# Patient Record
Sex: Female | Born: 1980 | Race: White | Hispanic: No | Marital: Married | Smoking: Never smoker
Health system: Southern US, Community
[De-identification: ages and names within clinical notes are randomized; demographics above are authoritative.]

## PROBLEM LIST (undated history)

## (undated) DIAGNOSIS — Z87898 Personal history of other specified conditions: Secondary | ICD-10-CM

## (undated) DIAGNOSIS — Z8742 Personal history of other diseases of the female genital tract: Secondary | ICD-10-CM

## (undated) DIAGNOSIS — Z9889 Other specified postprocedural states: Secondary | ICD-10-CM

## (undated) HISTORY — DX: Personal history of other specified conditions: Z87.898

## (undated) HISTORY — DX: Other specified postprocedural states: Z98.890

## (undated) HISTORY — DX: Personal history of other diseases of the female genital tract: Z87.42

---

## 2004-12-11 DIAGNOSIS — Z8742 Personal history of other diseases of the female genital tract: Secondary | ICD-10-CM

## 2004-12-11 HISTORY — PX: OVARIAN CYST REMOVAL: SHX89

## 2004-12-11 HISTORY — DX: Personal history of other diseases of the female genital tract: Z87.42

## 2010-04-21 LAB — US OB COMP + 14 WK

## 2010-07-28 LAB — US OB COMP + 14 WK

## 2010-08-13 DIAGNOSIS — Z87898 Personal history of other specified conditions: Secondary | ICD-10-CM

## 2010-08-13 HISTORY — DX: Personal history of other specified conditions: Z87.898

## 2010-09-07 ENCOUNTER — Inpatient Hospital Stay (HOSPITAL_COMMUNITY)
Admission: AD | Admit: 2010-09-07 | Discharge: 2010-09-09 | Payer: Self-pay | Source: Home / Self Care | Attending: Obstetrics and Gynecology | Admitting: Obstetrics and Gynecology

## 2010-09-08 LAB — CBC
HCT: 33.7 % — ABNORMAL LOW (ref 36.0–46.0)
Hemoglobin: 11.4 g/dL — ABNORMAL LOW (ref 12.0–15.0)
MCH: 28.1 pg (ref 26.0–34.0)
MCHC: 33.8 g/dL (ref 30.0–36.0)

## 2010-09-09 LAB — CBC
HCT: 31.9 % — ABNORMAL LOW (ref 36.0–46.0)
MCV: 85.5 fL (ref 78.0–100.0)
RDW: 12.8 % (ref 11.5–15.5)
WBC: 9.7 10*3/uL (ref 4.0–10.5)

## 2011-10-02 ENCOUNTER — Encounter: Payer: Self-pay | Admitting: Family Medicine

## 2011-10-02 ENCOUNTER — Ambulatory Visit (INDEPENDENT_AMBULATORY_CARE_PROVIDER_SITE_OTHER): Payer: Self-pay | Admitting: Family Medicine

## 2011-10-02 ENCOUNTER — Telehealth: Payer: Self-pay | Admitting: Family Medicine

## 2011-10-02 VITALS — BP 120/76 | HR 88 | Temp 99.2°F | Wt 123.0 lb

## 2011-10-02 DIAGNOSIS — J019 Acute sinusitis, unspecified: Secondary | ICD-10-CM | POA: Insufficient documentation

## 2011-10-02 MED ORDER — AMOXICILLIN 875 MG PO TABS: 875.0000 mg | ORAL_TABLET | Freq: Two times a day (BID) | ORAL | Status: AC

## 2011-10-02 NOTE — Progress Notes (Signed)
Office Note 10/02/2011  CC:  Chief Complaint  Patient presents with  . Establish Care    cold, temp x 1 week    HPI:  Stephanie Mclaughlin is a 31 y.o. White female who is here to establish care and discuss recent URI/fevers. Patient's most recent primary MD: none [facility]). Old records were not reviewed prior to or during today's visit.  Pt presents complaining of respiratory symptoms for 7  days.  Mostly nasal congestion/runny nose, sneezing, and PND without any cough.  She's been monitoring temp in ear as a matter of routine (no subjective f/c) and has noted T 99-100 daily during this illness.  Lately the symptoms seem to be staying the same. No wheezing and no SOB.  No pain in face or teeth.  +HA on/off, responded to tylenol.  Throat irritated/scratchy but not sore.  Symptoms made worse by night time, cold air.  Symptoms improved by nothing. Smoker? no Recent sick contact? Yes, both of her young childen have had recent URIs with cough (of note, they are not vaccinated). Muscle or joint aches? Mild shoulder achiness with this.   Flu shot this season at least 2 wks ago? no  ROS: no n/v/d or abdominal pain.  No rash.  No neck stiffness.   +Mild fatigue.  +Mild appetite loss.  Past Medical History  Diagnosis Date  . History of ovarian cystectomy 12/2004    benign (left)    Past Surgical History  Procedure Date  . Ovarian cyst removal 12/2004    History reviewed. No pertinent family history.  History   Social History  . Marital Status: Married    Spouse Name: N/A    Number of Children: N/A  . Years of Education: N/A   Occupational History  . Not on file.   Social History Main Topics  . Smoking status: Never Smoker   . Smokeless tobacco: Never Used  . Alcohol Use: No  . Drug Use: No  . Sexually Active: Not on file   Other Topics Concern  . 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    MEDS: Tylenol prn  No Known Allergies  ROS Review of Systems  Constitutional: Negative for fever, chills, appetite change and fatigue.  HENT:       See HPI   Eyes: Negative for discharge, redness and visual disturbance.  Respiratory: Negative for cough, chest tightness, shortness of breath and wheezing.   Cardiovascular: Negative for chest pain, palpitations and leg swelling.  Gastrointestinal: Negative for nausea, vomiting, abdominal pain, diarrhea and blood in stool.  Genitourinary: Negative for dysuria, urgency, frequency, hematuria, flank pain and difficulty urinating.  Musculoskeletal: Negative for back pain and joint swelling.  Skin: Negative for pallor and rash.  Neurological: Negative for dizziness, speech difficulty, weakness and headaches.  Hematological: Negative for adenopathy. Does not bruise/bleed easily.  Psychiatric/Behavioral: Negative for confusion and sleep disturbance. The patient is not nervous/anxious.     PE; Blood pressure 120/76, pulse 88, temperature 99.2 F (37.3 C), temperature source Temporal, weight 123 lb (55.792 kg), last menstrual period 08/31/2011, SpO2 99.00%. VS: noted--normal. Gen: alert, NAD, well appearing.  Pleasant affect. HEENT: eyes without injection, drainage, or swelling.  Ears: EACs clear, TMs with normal light reflex and landmarks.  Nose: Clear rhinorrhea, with  some dried, crusty exudate adherent to mildly injected and edematous mucosa.  No purulent d/c.  No paranasal sinus TTP.  No facial swelling.  Throat and mouth without focal lesion.  No pharyngial swelling, erythema, or exudate.   Neck: supple, no LAD.   LUNGS: CTA bilat, nonlabored resps.   CV: RRR, no m/r/g. EXT: no c/c/e SKIN: no rash   Pertinent labs:  none  ASSESSMENT AND PLAN:   New pt:  Sinusitis  acute Amoxil 875mg  bid x 10d. Discussed use of nonsedating OTC antihistamine and saline nasal spray prn. Rest, fluids.     Return if symptoms worsen or fail to improve.

## 2011-10-02 NOTE — Assessment & Plan Note (Signed)
Amoxil 875mg  bid x 10d. Discussed use of nonsedating OTC antihistamine and saline nasal spray prn. Rest, fluids.

## 2011-10-02 NOTE — Telephone Encounter (Signed)
Faxed request

## 2011-10-02 NOTE — Telephone Encounter (Signed)
Pls request records from [facility].  thx.

## 2011-10-02 NOTE — Patient Instructions (Signed)
Try OTC allegra 180mg  OR zyrtec 10mg  OR claritin 10mg  as directed for your nasal mucous/post-nasal drip. Also, try OTC nasal saline spray 2-3 times per day.

## 2011-10-17 ENCOUNTER — Encounter: Payer: Self-pay | Admitting: Family Medicine

## 2013-10-31 ENCOUNTER — Emergency Department: Payer: Self-pay | Admitting: Internal Medicine

## 2013-10-31 LAB — COMPREHENSIVE METABOLIC PANEL
ALT: 11 U/L — AB (ref 12–78)
Albumin: 3.7 g/dL (ref 3.4–5.0)
Alkaline Phosphatase: 38 U/L — ABNORMAL LOW
Anion Gap: 7 (ref 7–16)
BILIRUBIN TOTAL: 0.4 mg/dL (ref 0.2–1.0)
BUN: 12 mg/dL (ref 7–18)
CALCIUM: 8.5 mg/dL (ref 8.5–10.1)
CHLORIDE: 104 mmol/L (ref 98–107)
CO2: 26 mmol/L (ref 21–32)
CREATININE: 0.82 mg/dL (ref 0.60–1.30)
EGFR (African American): >60
Glucose: 92 mg/dL (ref 65–99)
Osmolality: 273 (ref 275–301)
POTASSIUM: 3.6 mmol/L (ref 3.5–5.1)
SGOT(AST): 12 U/L — ABNORMAL LOW (ref 15–37)
Sodium: 137 mmol/L (ref 136–145)
TOTAL PROTEIN: 7.8 g/dL (ref 6.4–8.2)

## 2013-10-31 LAB — CBC
HCT: 39.3 % (ref 35.0–47.0)
HGB: 12.9 g/dL (ref 12.0–16.0)
MCH: 27.6 pg (ref 26.0–34.0)
MCHC: 32.8 g/dL (ref 32.0–36.0)
MCV: 84 fL (ref 80–100)
PLATELETS: 276 10*3/uL (ref 150–440)
RBC: 4.68 10*6/uL (ref 3.80–5.20)
RDW: 14.6 % — AB (ref 11.5–14.5)
WBC: 12.7 10*3/uL — ABNORMAL HIGH (ref 3.6–11.0)

## 2013-10-31 LAB — URINALYSIS, COMPLETE
BACTERIA: NONE SEEN
GLUCOSE, UR: NEGATIVE mg/dL (ref 0–75)
LEUKOCYTE ESTERASE: NEGATIVE
Nitrite: NEGATIVE
Ph: 6 (ref 4.5–8.0)
Protein: NEGATIVE
RBC, UR: NONE SEEN /HPF (ref 0–5)
Specific Gravity: 1.02 (ref 1.003–1.030)
Squamous Epithelial: <1

## 2013-10-31 LAB — LIPASE, BLOOD: Lipase: 114 U/L (ref 73–393)

## 2013-12-22 ENCOUNTER — Other Ambulatory Visit: Payer: Self-pay | Admitting: Nurse Practitioner

## 2013-12-22 DIAGNOSIS — N6310 Unspecified lump in the right breast, unspecified quadrant: Secondary | ICD-10-CM

## 2013-12-24 ENCOUNTER — Other Ambulatory Visit: Payer: Self-pay | Admitting: Nurse Practitioner

## 2013-12-24 ENCOUNTER — Other Ambulatory Visit: Payer: Self-pay | Admitting: Family Medicine

## 2013-12-24 DIAGNOSIS — N6310 Unspecified lump in the right breast, unspecified quadrant: Secondary | ICD-10-CM

## 2013-12-25 ENCOUNTER — Other Ambulatory Visit: Payer: Self-pay | Admitting: Family Medicine

## 2013-12-25 DIAGNOSIS — N6310 Unspecified lump in the right breast, unspecified quadrant: Secondary | ICD-10-CM

## 2014-01-05 ENCOUNTER — Ambulatory Visit
Admission: RE | Admit: 2014-01-05 | Discharge: 2014-01-05 | Disposition: A | Discharge status: Home / Self Care | Payer: PRIVATE HEALTH INSURANCE | Source: Ambulatory Visit | Attending: Nurse Practitioner | Admitting: Nurse Practitioner

## 2014-01-05 DIAGNOSIS — N6310 Unspecified lump in the right breast, unspecified quadrant: Secondary | ICD-10-CM

## 2014-11-07 ENCOUNTER — Emergency Department: Payer: Self-pay | Admitting: Emergency Medicine

## 2014-11-07 LAB — URINALYSIS, COMPLETE
Bacteria: NONE SEEN
Bilirubin,UR: NEGATIVE
Glucose,UR: NEGATIVE mg/dL (ref 0–75)
LEUKOCYTE ESTERASE: NEGATIVE
Nitrite: NEGATIVE
PH: 5 (ref 4.5–8.0)
Specific Gravity: 1.026 (ref 1.003–1.030)
Squamous Epithelial: NONE SEEN
WBC UR: 4 /HPF (ref 0–5)

## 2014-11-07 LAB — CBC
HCT: 38.9 % (ref 35.0–47.0)
HGB: 13.2 g/dL (ref 12.0–16.0)
MCH: 30.4 pg (ref 26.0–34.0)
MCHC: 34 g/dL (ref 32.0–36.0)
MCV: 90 fL (ref 80–100)
PLATELETS: 221 10*3/uL (ref 150–440)
RBC: 4.35 10*6/uL (ref 3.80–5.20)
RDW: 12.3 % (ref 11.5–14.5)
WBC: 7.1 10*3/uL (ref 3.6–11.0)

## 2014-11-07 LAB — HCG, QUANTITATIVE, PREGNANCY: BETA HCG, QUANT.: 270617 m[IU]/mL — AB

## 2016-09-01 ENCOUNTER — Emergency Department
Admission: EM | Admit: 2016-09-01 | Discharge: 2016-09-01 | Disposition: A | Discharge status: Home / Self Care | Payer: PRIVATE HEALTH INSURANCE | Attending: Emergency Medicine | Admitting: Emergency Medicine

## 2016-09-01 ENCOUNTER — Encounter: Payer: Self-pay | Admitting: Emergency Medicine

## 2016-09-01 ENCOUNTER — Emergency Department: Payer: PRIVATE HEALTH INSURANCE

## 2016-09-01 DIAGNOSIS — J181 Lobar pneumonia, unspecified organism: Secondary | ICD-10-CM | POA: Insufficient documentation

## 2016-09-01 DIAGNOSIS — R110 Nausea: Secondary | ICD-10-CM | POA: Insufficient documentation

## 2016-09-01 DIAGNOSIS — J189 Pneumonia, unspecified organism: Secondary | ICD-10-CM

## 2016-09-01 MED ORDER — AZITHROMYCIN 250 MG PO TABS: ORAL_TABLET | ORAL | 0 refills | Status: DC

## 2016-09-01 MED ORDER — ALBUTEROL SULFATE HFA 108 (90 BASE) MCG/ACT IN AERS: 2.0000 | INHALATION_SPRAY | Freq: Four times a day (QID) | RESPIRATORY_TRACT | 2 refills | Status: DC | PRN

## 2016-09-01 MED ORDER — IPRATROPIUM-ALBUTEROL 0.5-2.5 (3) MG/3ML IN SOLN
3.0000 mL | Freq: Once | RESPIRATORY_TRACT | Status: AC
  Administered 2016-09-01: 3 mL via RESPIRATORY_TRACT
  Filled 2016-09-01: qty 3

## 2016-09-01 MED ORDER — HYDROCODONE-ACETAMINOPHEN 5-325 MG PO TABS
1.0000 | ORAL_TABLET | Freq: Once | ORAL | Status: AC
  Administered 2016-09-01: 1 via ORAL

## 2016-09-01 MED ORDER — HYDROCOD POLST-CPM POLST ER 10-8 MG/5ML PO SUER
5.0000 mL | Freq: Two times a day (BID) | ORAL | 0 refills | Status: DC
Start: 2016-09-01 — End: 2018-12-03

## 2016-09-01 MED ORDER — ONDANSETRON 4 MG PO TBDP
4.0000 mg | ORAL_TABLET | Freq: Once | ORAL | Status: DC
  Filled 2016-09-01: qty 1

## 2016-09-01 MED ORDER — HYDROCODONE-ACETAMINOPHEN 5-325 MG PO TABS
2.0000 | ORAL_TABLET | Freq: Once | ORAL | Status: DC
  Filled 2016-09-01: qty 2

## 2016-09-01 NOTE — ED Notes (Signed)
Returned from XR 

## 2016-09-01 NOTE — ED Notes (Signed)
Dx with the flu 10 days ago, pain in rib cage when coughing bilaterally.  Pt states at rest her pain is 6/10 when coughing pain is 10/10.  Painful to take in a deep breath.

## 2016-09-01 NOTE — Discharge Instructions (Signed)
Take Zithromax for the next 5 days. This antibiotic was standing near system for 10 days. Tussionex 1 teaspoon every 12 hours as needed for cough. This medication has a narcotic in it and may cause drowsiness. Albuterol inhaler 2 puffs every 6 hours if needed for wheezing or shortness of breath. He may follow up with your doctor in [place] [facility]. If any worsening of your symptoms return to the emergency room.

## 2016-09-01 NOTE — ED Notes (Signed)
Pt refused zofran that was ordered, pt hadn't been wanting to eat so provider thought maybe pt would benefit from some anti-nausea medication, but pt refused. Explained to pt why this was ordered, however, pt still declined.

## 2016-09-01 NOTE — ED Notes (Signed)
Pt verbalized understanding of discharge instructions. NAD at this time. 

## 2016-09-01 NOTE — ED Triage Notes (Signed)
Cough, fever, pain in left ribs when coughing, sore throat.  No resp distress.  Mask applied.

## 2016-09-01 NOTE — ED Provider Notes (Signed)
[facility] Emergency Department Provider Note  ____________________________________________   First MD Initiated Contact with Patient 09/01/16 [phone]     (approximate)  I have reviewed the triage vital signs and the nursing notes.   HISTORY  Chief Complaint Cough   HPI Stephanie Mclaughlin is a 36 y.o. female is here with complaint of cough and fever. Husband states that patient and child at home was presumed to have the flu approximately 10 days ago. Child is already running around playing with a continued cough but is doing much better. Patient has continued to cough and last night began complaining of painful ribs with deep inspiration. Patient has subjective fever and occasional chills. There is been some minimal nausea and decreased appetite. Patient is been taking some over-the-counter medication with minimal relief. She denies any vomiting or diarrhea. She has never been a smoker. She denies any history of asthma, bronchitis or pneumonia. She rates her pain as an 8 out of 10.   Past Medical History:  Diagnosis Date  . History of mastitis 2012   Postpartum  . History of ovarian cystectomy 12/2004   benign (left)  . NSVD (normal spontaneous vaginal delivery)     X 2     Patient Active Problem List   Diagnosis Date Noted  . Sinusitis acute 10/02/2011    Past Surgical History:  Procedure Laterality Date  . OVARIAN CYST REMOVAL  12/2004    Prior to Admission medications   Medication Sig Start Date End Date Taking? Authorizing Provider  albuterol (PROVENTIL HFA;VENTOLIN HFA) 108 (90 Base) MCG/ACT inhaler Inhale 2 puffs into the lungs every 6 (six) hours as needed for wheezing or shortness of breath. 09/01/16   Tommi Rumps, PA-C  azithromycin (ZITHROMAX Z-PAK) 250 MG tablet Take 2 tablets (500 mg) on  Day 1,  followed by 1 tablet (250 mg) once daily on Days 2 through 5. 09/01/16   Tommi Rumps, PA-C  chlorpheniramine-HYDROcodone (TUSSIONEX  PENNKINETIC ER) 10-8 MG/5ML SUER Take 5 mLs by mouth 2 (two) times daily. 09/01/16   Tommi Rumps, PA-C    Allergies Patient has no known allergies.  No family history on file.  Social History Social History  Substance Use Topics  . Smoking status: Never Smoker  . Smokeless tobacco: Never Used  . Alcohol use No    Review of Systems Constitutional:Subjective fever/chills Eyes: No visual changes. ENT: No sore throat. Cardiovascular: Denies chest pain. Respiratory: Positive shortness of breath. Positive chest wall pain with cough. Gastrointestinal: No abdominal pain.  Positive nausea, no vomiting.  No diarrhea.   Musculoskeletal: Positive for rib pain. Skin: Negative for rash. Neurological: Negative for headaches, focal weakness or numbness.  10-point ROS otherwise negative.  ____________________________________________   PHYSICAL EXAM:  VITAL SIGNS: ED Triage Vitals [09/01/16 0825]  Enc Vitals Group     BP 100/62     Pulse Rate 95     Resp 18     Temp 98.2 F (36.8 C)     Temp src      SpO2 96 %     Weight 130 lb (59 kg)     Height 5\' 7"  (1.702 m)     Head Circumference      Peak Flow      Pain Score 8     Pain Loc      Pain Edu?      Excl. in GC?     Constitutional: Alert and oriented. Well appearing and in  no acute distress. Eyes: Conjunctivae are normal. PERRL. EOMI. Head: Atraumatic. Nose: Mild congestion/rhinnorhea. Mouth/Throat: Mucous membranes are moist.  Oropharynx non-erythematous. Neck: No stridor.   Hematological/Lymphatic/Immunilogical: No cervical lymphadenopathy. Cardiovascular: Normal rate, regular rhythm. Grossly normal heart sounds.  Good peripheral circulation. Respiratory: Normal respiratory effort.  No retractions. Lungs mild bilateral expiratory wheeze and crackles with coughing. Right is greater than the left side. Musculoskeletal: Moves upper and lower extremities without any difficulty. Gait is slow but steady. Neurologic:   Normal speech and language. No gross focal neurologic deficits are appreciated.  Skin:  Skin is warm, dry and intact. No rash noted. Psychiatric: Mood and affect are normal. Speech and behavior are normal.  ____________________________________________   LABS (all labs ordered are listed, but only abnormal results are displayed)  Labs Reviewed - No data to display  RADIOLOGY Chest x-ray per radiologist: IMPRESSION:  Mild airspace disease in the right middle lobe most concerning for  atelectasis versus pneumonia.     ____________________________________________   PROCEDURES  Procedure(s) performed: None  Procedures  Critical Care performed: No  ____________________________________________   INITIAL IMPRESSION / ASSESSMENT AND PLAN / ED COURSE  Pertinent labs & imaging results that were available during my care of the patient were reviewed by me and considered in my medical decision making (see chart for details).  Patient was given a DuoNeb treatment while in the emergency room and improved greatly. Patient was also given Norco to reduce cough and also help with her chest wall pain. Reevaluation after the DuoNeb treatment patient was exchanging air much better and was less coughing. Patient appeared to be resting comfortably. Patient was discharged with prescription for albuterol inhaler, Zithromax, and Tussionex 1 teaspoon every 12 hours as needed for cough. She is aware that this medication contains a narcotic and could cause drowsiness. Follow-up with her primary care doctor and [place] or [facility] if any continued problems. Husband is aware that should she become much worse she is to return to the emergency room.    ___________________________________________   FINAL CLINICAL IMPRESSION(S) / ED DIAGNOSES  Final diagnoses:  Community acquired pneumonia of right middle lobe of lung (HCC)      NEW MEDICATIONS STARTED DURING THIS VISIT:  Discharge  Medication List as of 09/01/2016 10:16 AM    START taking these medications   Details  albuterol (PROVENTIL HFA;VENTOLIN HFA) 108 (90 Base) MCG/ACT inhaler Inhale 2 puffs into the lungs every 6 (six) hours as needed for wheezing or shortness of breath., Starting Sat 09/01/2016, Print    azithromycin (ZITHROMAX Z-PAK) 250 MG tablet Take 2 tablets (500 mg) on  Day 1,  followed by 1 tablet (250 mg) once daily on Days 2 through 5., Print    chlorpheniramine-HYDROcodone (TUSSIONEX PENNKINETIC ER) 10-8 MG/5ML SUER Take 5 mLs by mouth 2 (two) times daily., Starting Sat 09/01/2016, Print         Note:  This document was prepared using Dragon voice recognition software and may include unintentional dictation errors.    Tommi RumpsRhonda L Halea Lieb, PA-C 09/01/16 1100    Minna AntisKevin Paduchowski, MD 09/01/16 1555

## 2018-12-03 ENCOUNTER — Emergency Department: Payer: PRIVATE HEALTH INSURANCE

## 2018-12-03 ENCOUNTER — Other Ambulatory Visit: Payer: Self-pay

## 2018-12-03 ENCOUNTER — Emergency Department
Admission: EM | Admit: 2018-12-03 | Discharge: 2018-12-03 | Disposition: A | Discharge status: Home / Self Care | Payer: PRIVATE HEALTH INSURANCE | Attending: Emergency Medicine | Admitting: Emergency Medicine

## 2018-12-03 DIAGNOSIS — R1030 Lower abdominal pain, unspecified: Secondary | ICD-10-CM | POA: Diagnosis present

## 2018-12-03 DIAGNOSIS — K3580 Unspecified acute appendicitis: Secondary | ICD-10-CM

## 2018-12-03 DIAGNOSIS — R102 Pelvic and perineal pain: Secondary | ICD-10-CM

## 2018-12-03 LAB — URINALYSIS, COMPLETE (UACMP) WITH MICROSCOPIC
Bacteria, UA: NONE SEEN
Bilirubin Urine: NEGATIVE
Glucose, UA: NEGATIVE mg/dL
Ketones, ur: 20 mg/dL — AB
Leukocytes,Ua: NEGATIVE
Nitrite: NEGATIVE
Protein, ur: NEGATIVE mg/dL
Specific Gravity, Urine: 1.025 (ref 1.005–1.030)
pH: 5 (ref 5.0–8.0)

## 2018-12-03 LAB — COMPREHENSIVE METABOLIC PANEL
ALT: 10 U/L (ref 0–44)
AST: 15 U/L (ref 15–41)
Albumin: 4.3 g/dL (ref 3.5–5.0)
Alkaline Phosphatase: 48 U/L (ref 38–126)
Anion gap: 12 (ref 5–15)
BUN: 12 mg/dL (ref 6–20)
CO2: 25 mmol/L (ref 22–32)
Calcium: 8.8 mg/dL — ABNORMAL LOW (ref 8.9–10.3)
Chloride: 102 mmol/L (ref 98–111)
Creatinine, Ser: 0.72 mg/dL (ref 0.44–1.00)
GFR calc Af Amer: >60 mL/min (ref >60)
GFR calc non Af Amer: >60 mL/min (ref >60)
Glucose, Bld: 94 mg/dL (ref 70–99)
Potassium: 3.6 mmol/L (ref 3.5–5.1)
Sodium: 139 mmol/L (ref 135–145)
Total Bilirubin: 0.8 mg/dL (ref 0.3–1.2)
Total Protein: 7.6 g/dL (ref 6.5–8.1)

## 2018-12-03 LAB — CBC
HCT: 43.4 % (ref 36.0–46.0)
Hemoglobin: 14.2 g/dL (ref 12.0–15.0)
MCH: 29.8 pg (ref 26.0–34.0)
MCHC: 32.7 g/dL (ref 30.0–36.0)
MCV: 91.2 fL (ref 80.0–100.0)
Platelets: 246 10*3/uL (ref 150–400)
RBC: 4.76 MIL/uL (ref 3.87–5.11)
RDW: 12.3 % (ref 11.5–15.5)
WBC: 7.9 10*3/uL (ref 4.0–10.5)
nRBC: 0 % (ref 0.0–0.2)

## 2018-12-03 LAB — LIPASE, BLOOD: Lipase: 27 U/L (ref 11–51)

## 2018-12-03 LAB — POCT PREGNANCY, URINE: Preg Test, Ur: NEGATIVE

## 2018-12-03 MED ORDER — SODIUM CHLORIDE 0.9 % IV SOLN: Freq: Once | INTRAVENOUS | Status: DC

## 2018-12-03 MED ORDER — AMOXICILLIN-POT CLAVULANATE ER 1000-62.5 MG PO TB12: 1.0000 | ORAL_TABLET | Freq: Two times a day (BID) | ORAL | 0 refills | Status: AC

## 2018-12-03 MED ORDER — IOHEXOL 300 MG/ML  SOLN
100.0000 mL | Freq: Once | INTRAMUSCULAR | Status: AC | PRN
  Administered 2018-12-03: 11:00:00 100 mL via INTRAVENOUS

## 2018-12-03 NOTE — ED Notes (Signed)
Pt still pumping breast milk- will start fluids after she is finished

## 2018-12-03 NOTE — ED Notes (Signed)
Patient transported to Ultrasound 

## 2018-12-03 NOTE — ED Notes (Signed)
From 9-5 yesterday abdominal pain all over- tenderness in the right lower abdomen since- pain comes and goes in waves

## 2018-12-03 NOTE — ED Triage Notes (Addendum)
Pt reporting constant abdominal pain that started last night, right lower that radiates across to left side. Pt reports pain stopped last night but now has begun again. Denies NVD.  Pt alert and oriented X4, active, cooperative, pt in NAD. RR even and unlabored, color WNL.    Pt states that she did not eat much this morning for breakfast due to anticipation for "svcan". Explained to patient that she is correct in being NPO from this point on until further order from MD to eat/drink. When rooming patient, pt asked to provide urine sample-pt states that she needs water to provide urine sample, explained importance of continuing NPO status until evaluated by doctor.

## 2018-12-03 NOTE — ED Provider Notes (Signed)
[facility] Emergency Department Provider Note       Time seen: ----------------------------------------- 10:01 AM on 12/03/2018 -----------------------------------------   I have reviewed the triage vital signs and the nursing notes.  HISTORY   Chief Complaint Abdominal Pain    HPI Stephanie Mclaughlin is a 38 y.o. female with a history of normal vaginal deliveries and ovarian cystectomy who presents to the ED for lower quadrant pain.  Patient describes a constant abdominal pain that feels like a type of gas pain that started last night.  She has right lower quadrant pain that radiates to her left side.  She denies fevers, chills, chest pain, shortness of breath, vomiting or diarrhea.  Past Medical History:  Diagnosis Date  . History of mastitis 2012   Postpartum  . History of ovarian cystectomy 12/2004   benign (left)  . NSVD (normal spontaneous vaginal delivery)     X 2     Patient Active Problem List   Diagnosis Date Noted  . Sinusitis acute 10/02/2011    Past Surgical History:  Procedure Laterality Date  . OVARIAN CYST REMOVAL  12/2004    Allergies Patient has no known allergies.  Social History Social History   Tobacco Use  . Smoking status: Never Smoker  . Smokeless tobacco: Never Used  Substance Use Topics  . Alcohol use: No  . Drug use: No   Review of Systems Constitutional: Negative for fever. Cardiovascular: Negative for chest pain. Respiratory: Negative for shortness of breath. Gastrointestinal: Positive for abdominal pain Musculoskeletal: Negative for back pain. Skin: Negative for rash. Neurological: Negative for headaches, focal weakness or numbness.  All systems negative/normal/unremarkable except as stated in the HPI  ____________________________________________   PHYSICAL EXAM:  VITAL SIGNS: ED Triage Vitals [12/03/18 0943]  Enc Vitals Group     BP 126/81     Pulse Rate 90     Resp 18     Temp 98.5 F (36.9  C)     Temp Source Oral     SpO2 100 %     Weight 130 lb (59 kg)     Height 5\' 7"  (1.702 m)     Head Circumference      Peak Flow      Pain Score 0     Pain Loc      Pain Edu?      Excl. in GC?    Constitutional: Alert and oriented.  Anxious, no distress Eyes: Conjunctivae are normal. Normal extraocular movements. ENT      Head: Normocephalic and atraumatic.      Nose: No congestion/rhinnorhea.      Mouth/Throat: Mucous membranes are moist.      Neck: No stridor. Cardiovascular: Normal rate, regular rhythm. No murmurs, rubs, or gallops. Respiratory: Normal respiratory effort without tachypnea nor retractions. Breath sounds are clear and equal bilaterally. No wheezes/rales/rhonchi. Gastrointestinal: Right lower quadrant tenderness, no rebound or guarding.  Normal bowel sounds. Musculoskeletal: Nontender with normal range of motion in extremities. No lower extremity tenderness nor edema. Neurologic:  Normal speech and language. No gross focal neurologic deficits are appreciated.  Skin:  Skin is warm, dry and intact. No rash noted. Psychiatric: Mood and affect are normal. Speech and behavior are normal.  ____________________________________________  ED COURSE:  As part of my medical decision making, I reviewed the following data within the electronic MEDICAL RECORD NUMBER History obtained from family if available, nursing notes, old chart and ekg, as well as notes from prior ED visits. Patient  presented for right lower quadrant pain, we will assess with labs and imaging as indicated at this time.   Procedures  Ophelia CharterHeather Vanhoose was evaluated in Emergency Department on 12/03/2018 for the symptoms described in the history of present illness. She was evaluated in the context of the global COVID-19 pandemic, which necessitated consideration that the patient might be at risk for infection with the SARS-CoV-2 virus that causes COVID-19. Institutional protocols and algorithms that pertain to the  evaluation of patients at risk for COVID-19 are in a state of rapid change based on information released by regulatory bodies including the CDC and federal and state organizations. These policies and algorithms were followed during the patient's care in the ED.  ____________________________________________   LABS (pertinent positives/negatives)  Labs Reviewed  COMPREHENSIVE METABOLIC PANEL - Abnormal; Notable for the following components:      Result Value   Calcium 8.8 (*)    All other components within normal limits  URINALYSIS, COMPLETE (UACMP) WITH MICROSCOPIC - Abnormal; Notable for the following components:   Color, Urine YELLOW (*)    APPearance HAZY (*)    Hgb urine dipstick SMALL (*)    Ketones, ur 20 (*)    All other components within normal limits  LIPASE, BLOOD  CBC  POC URINE PREG, ED  POCT PREGNANCY, URINE    RADIOLOGY Images were viewed by me  Pelvic ultrasound does not reveal any acute process CT the abdomen pelvis with contrast IMPRESSION: 1. Equivocal for early acute appendicitis: Suggestion of thickened and dilated appendix along the right pelvic side wall, but no convincing regional inflammation, and it is possible this might be artifact from a decompressed distal small bowel loop. Depending on symptoms, a repeat CT in 12-24 hours of the pelvis to include the lower abdomen and following oral contrast administration may best evaluate further. 2. Questionable bowel wall thickening in the descending colon felt to be artifact due to under distention. 3. Otherwise normal CT appearance of the abdomen and pelvis. ____________________________________________   DIFFERENTIAL DIAGNOSIS   Appendicitis, renal colic, UTI, ovarian cyst, torsion  FINAL ASSESSMENT AND PLAN  Acute appendicitis   Plan: The patient had presented for lower quadrant pain. Patient's labs are reassuring. Patient's imaging did reveal acute appendicitis.  Case was reviewed with surgery who  agreed.  I have discussed this at length with the patient who would prefer not to have surgery at this time but would rather try oral antibiotics which I think is reasonable.  Her pain is gone at this point.  She be placed on Augmentin and referred to general surgery for outpatient follow-up.   Ulice DashJohnathan E Williams, MD    Note: This note was generated in part or whole with voice recognition software. Voice recognition is usually quite accurate but there are transcription errors that can and very often do occur. I apologize for any typographical errors that were not detected and corrected.     Emily FilbertWilliams, Jonathan E, MD 12/03/18 1254

## 2018-12-04 ENCOUNTER — Telehealth: Payer: Self-pay | Admitting: General Surgery

## 2018-12-04 NOTE — Telephone Encounter (Signed)
Patient called to follow-up from ED visit yesterday.  She reports that she is feeling better today.  She reports that she has been compliant with antibiotic regimen that was prescribed at the ED.  She reports that today the issue has been a headache most likely from the stress of the situation but reports that the abdominal pain is minimal and intermittent.  Denies any nausea or vomiting denies fever or chills.  Patient was oriented about the signs that will make her go back to the ED or come to my clinic.  Oriented that if she developed fever nausea vomiting or worsening abdominal pain she will have to call the clinic or go to the ED for urgent evaluation.

## 2018-12-04 NOTE — Telephone Encounter (Signed)
Patient called for follow up of her visit yesterday at ED. Patient with suspected appendicitis. ED physician discussed the case with me which I recommended surgery but patient did not want to have surgery at this moment since pain has resolved. It was discussed with me to see if I can follow her after discharge with oral antibiotic therapy. I agreed to follow.   Patient called this morning but no answer. I left a voicemail to call me back and I will try to call her back later if she does call.

## 2020-09-14 IMAGING — US US PELVIS COMPLETE
1 series · 13 of 25 positions shown · non-contrast
Comparison: CT abdomen pelvis from same day.

CLINICAL DATA: Acute pelvic pain.

EXAM:
TRANSABDOMINAL AND TRANSVAGINAL ULTRASOUND OF PELVIS
DOPPLER ULTRASOUND OF OVARIES
TECHNIQUE: Both transabdominal and transvaginal ultrasound examinations of the
pelvis were performed. Transabdominal technique was performed for
global imaging of the pelvis including uterus, ovaries, adnexal
regions, and pelvic cul-de-sac.
It was necessary to proceed with endovaginal exam following the
transabdominal exam to visualize the uterus, endometrium, ovaries,
and adnexa. Color and duplex Doppler ultrasound was utilized to
evaluate blood flow to the ovaries.

[Series 1: us pelvis complete · 0.22mm/px · 13 of 92 slices shown]
[im 1/92]
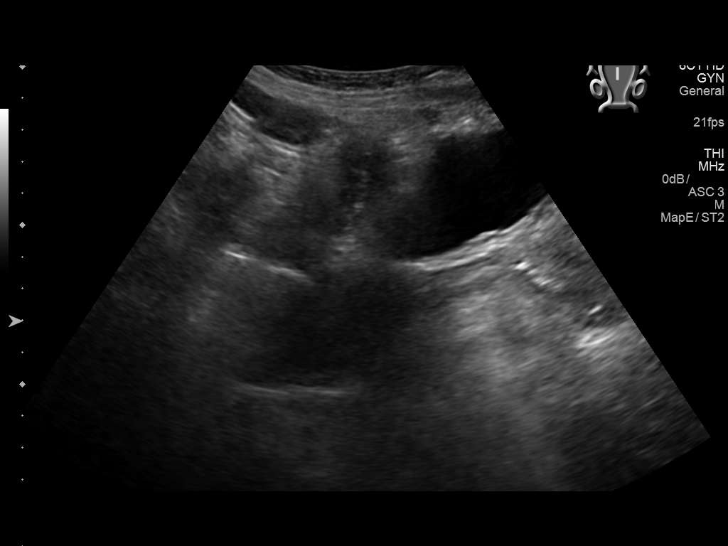
[im 8/92]
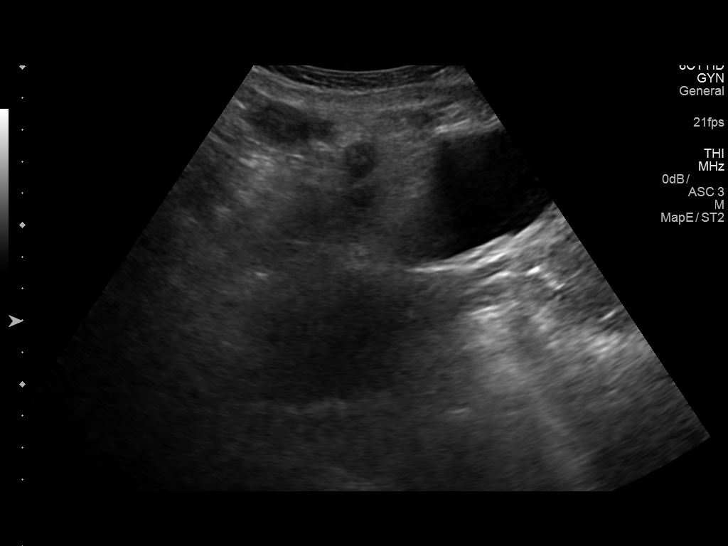
[im 16/92]
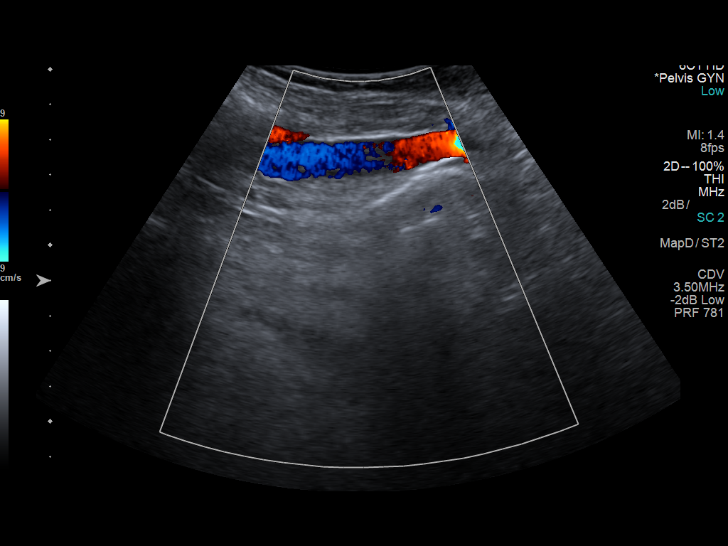
[im 23/92]
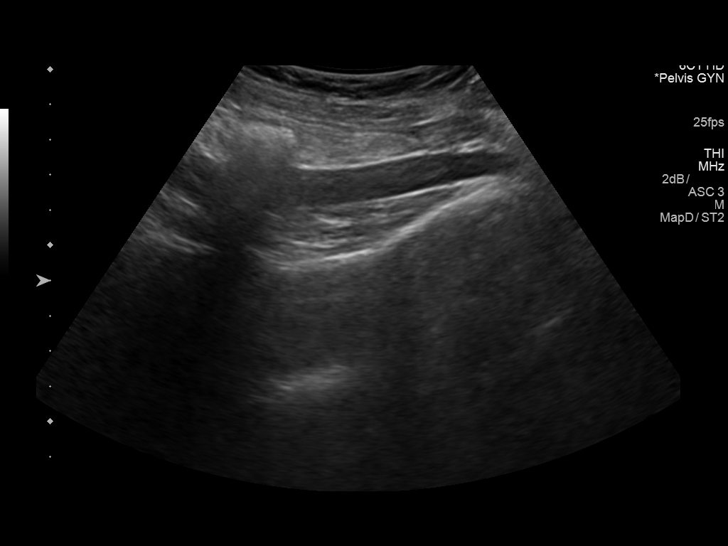
[im 31/92]
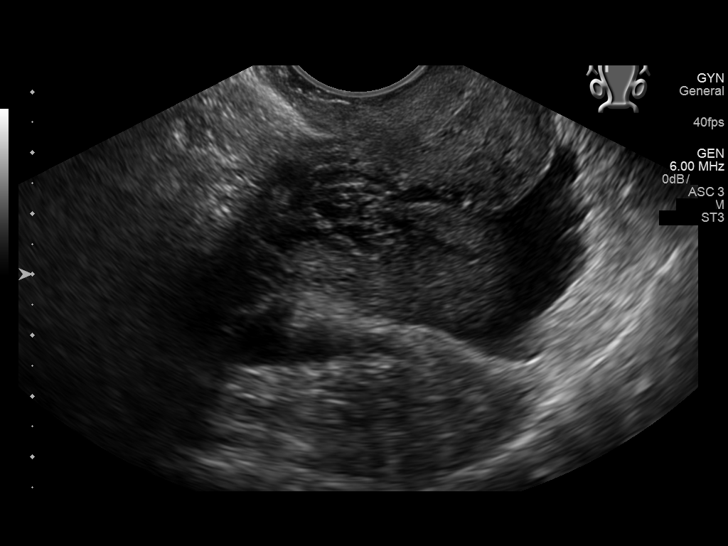
[im 38/92]
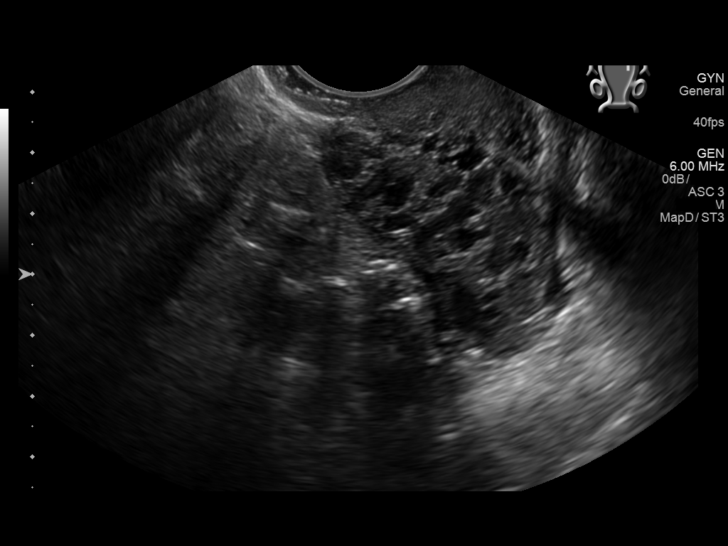
[im 46/92]
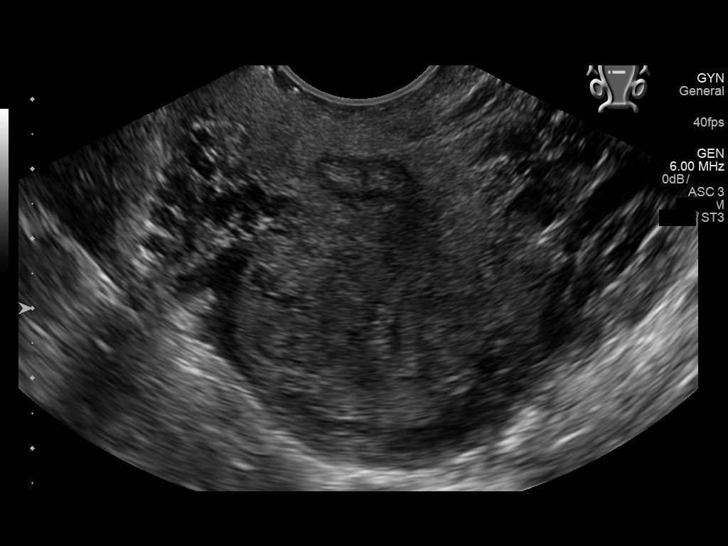
[im 54/92]
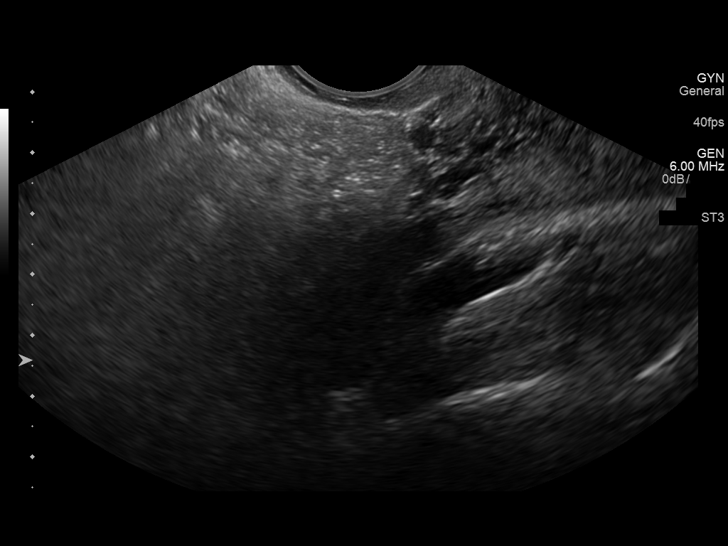
[im 61/92]
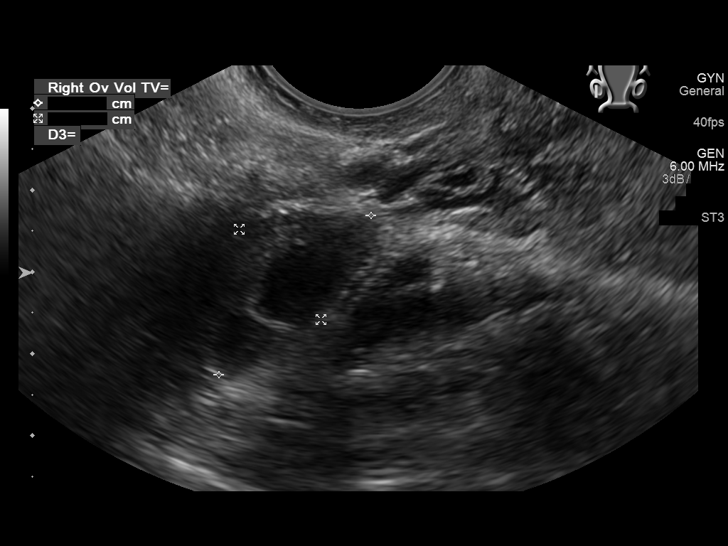
[im 69/92]
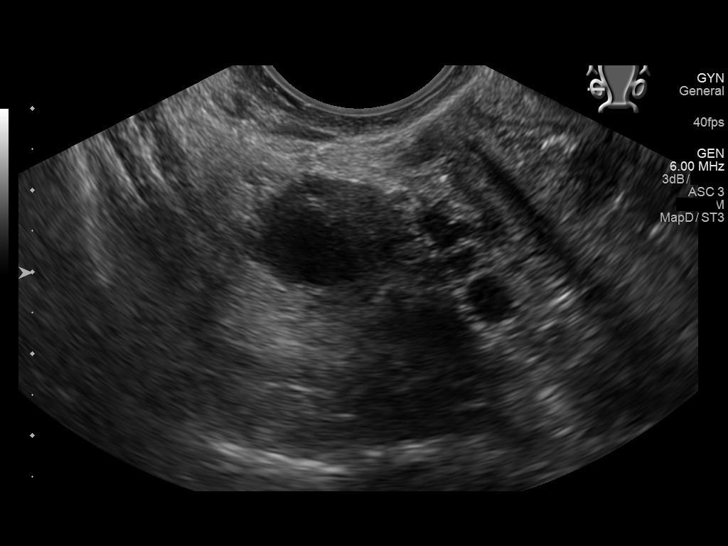
[im 76/92]
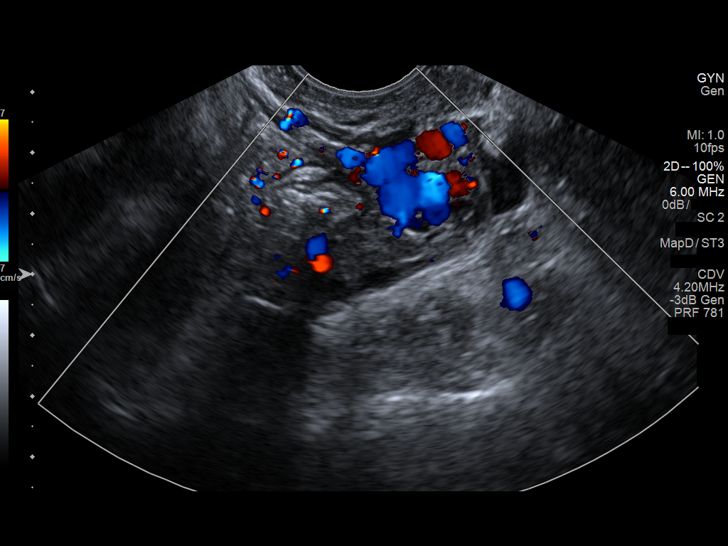
[im 84/92]
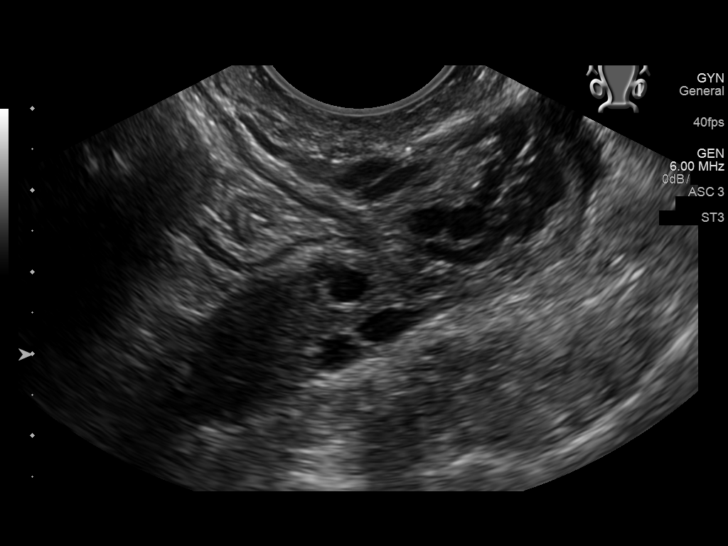
[im 92/92]
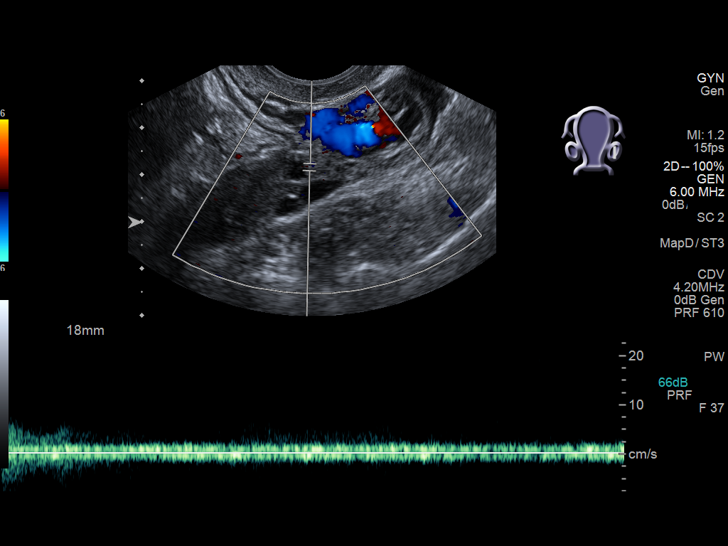

[13 of 25 positions shown; findings below may reference images not displayed]

FINDINGS: Uterus

Measurements: 5.1 x 4.0 x 4.3 cm = volume: 46 mL. No fibroids or
other mass visualized.

Endometrium

Thickness: 9 mm.  No focal abnormality visualized.

Right ovary

Measurements: 2.7 x 1.5 x 1.8 cm = volume: 4 mL. Normal
appearance/no adnexal mass.

Left ovary

Measurements: 2.5 x 1.4 x 1.5 cm = volume: 3 mL. Normal
appearance/no adnexal mass.

Pulsed Doppler evaluation of both ovaries demonstrates normal
low-resistance arterial and venous waveforms.

Other findings

Small free fluid in the pelvis, likely physiologic.
IMPRESSION: 1. Normal pelvic ultrasound.

## 2020-11-01 IMAGING — CT CT ABDOMEN AND PELVIS WITH CONTRAST
2 of 4 series · 15 of 46 positions shown, 17 images · IV contrast (APPLIED)
Comparison: Ob ultrasound 11/07/2014.

CLINICAL DATA: 37-year-old female with abdominal pain since last
night radiating to the left.

EXAM:
CT ABDOMEN AND PELVIS WITH CONTRAST
TECHNIQUE: Multidetector CT imaging of the abdomen and pelvis was performed
using the standard protocol following bolus administration of
intravenous contrast.
CONTRAST:  100mL OMNIPAQUE IOHEXOL 300 MG/ML  SOLN

[Series 2: routine abd/pel with · axial · 0.60mm/px · z∈[-530,-145]mm · 12 of 89 slices shown, 14 images]
[im 8/89  soft-tissue]
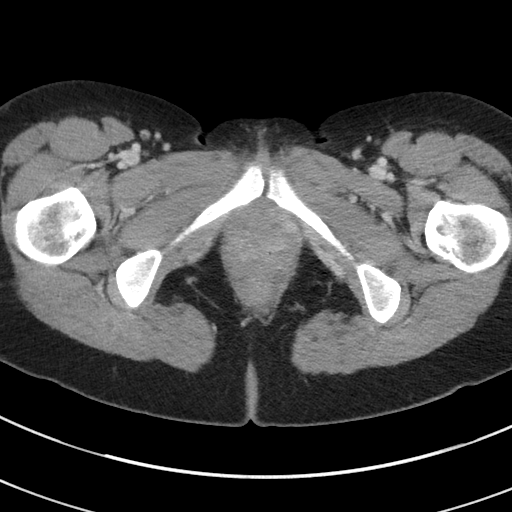
[im 8/89  bone]
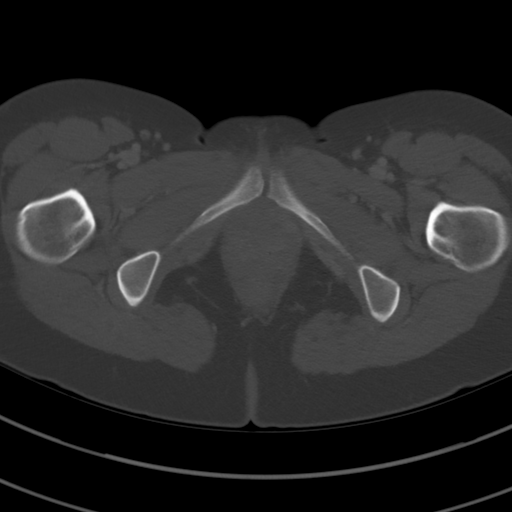
[im 15/89  soft-tissue]
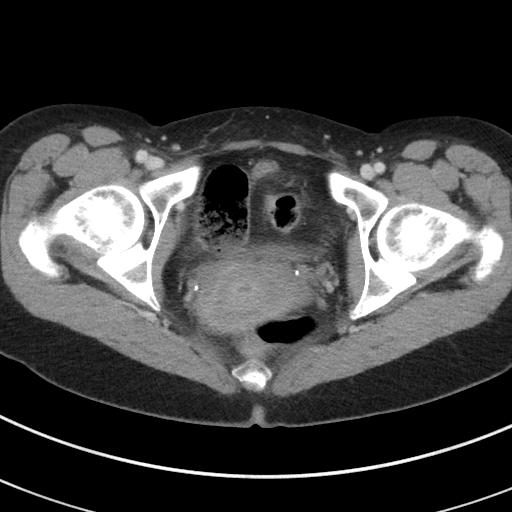
[im 22/89  soft-tissue]
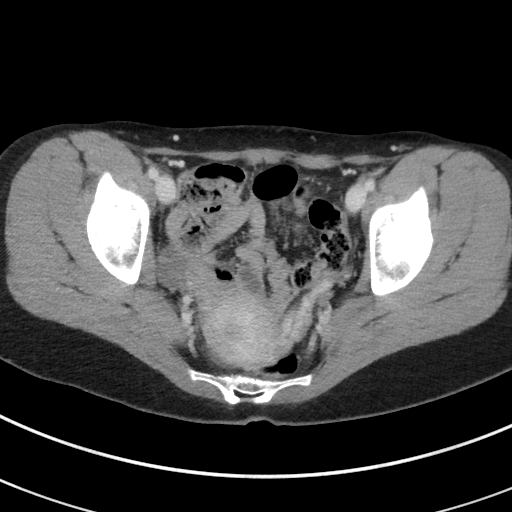
[im 29/89  soft-tissue]
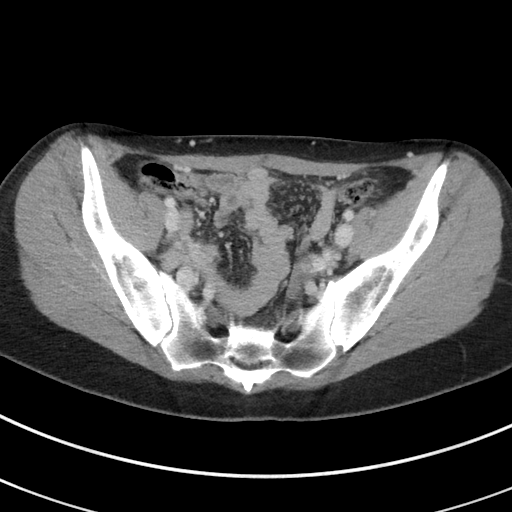
[im 36/89  soft-tissue]
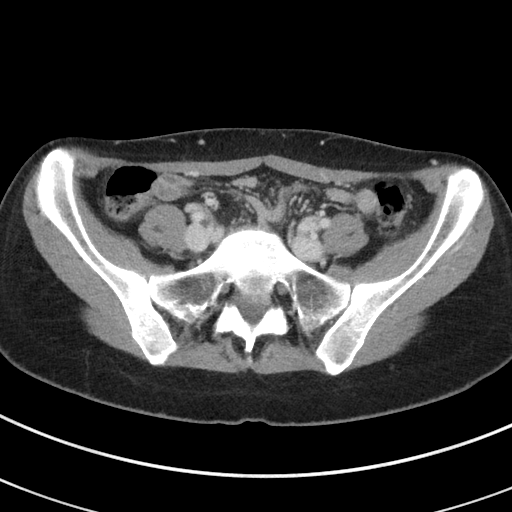
[im 43/89  soft-tissue]
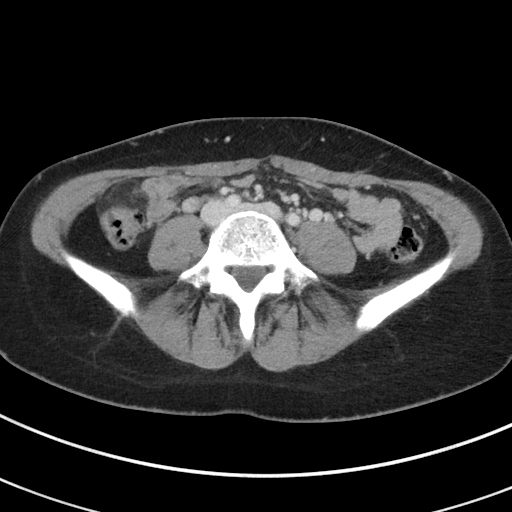
[im 50/89  soft-tissue]
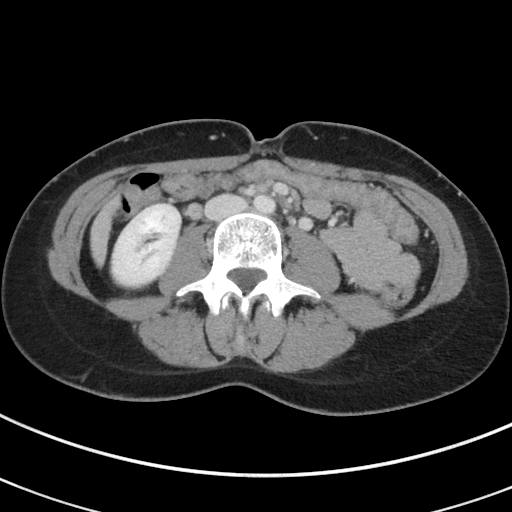
[im 57/89  soft-tissue]
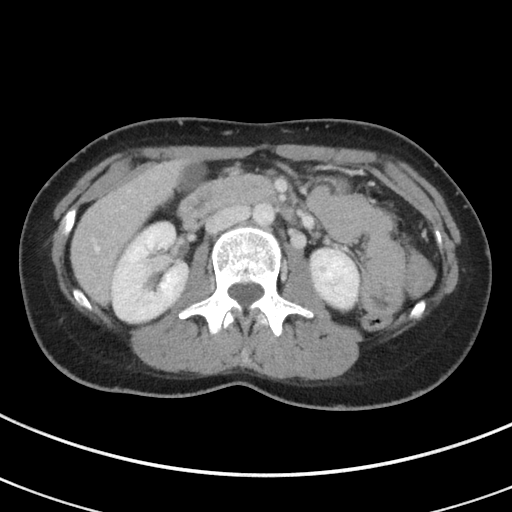
[im 64/89  soft-tissue]
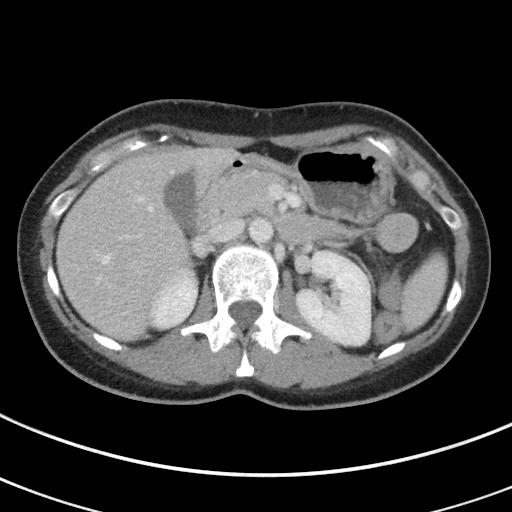
[im 64/89  bone]
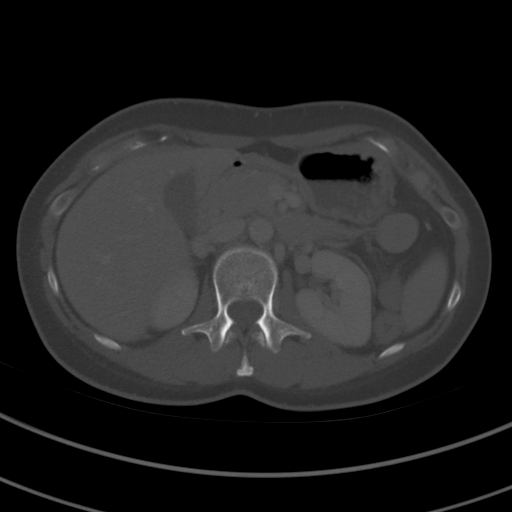
[im 71/89  soft-tissue]
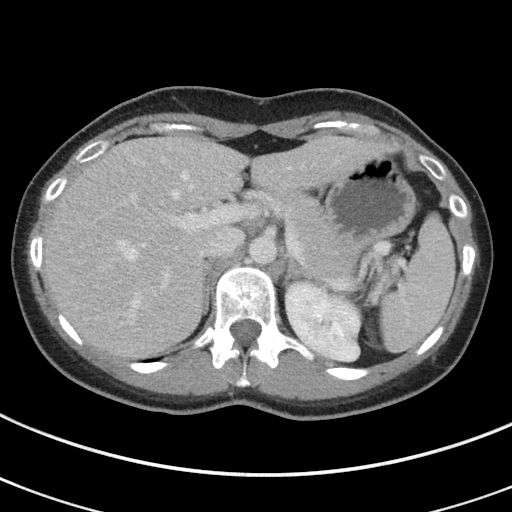
[im 78/89  soft-tissue]
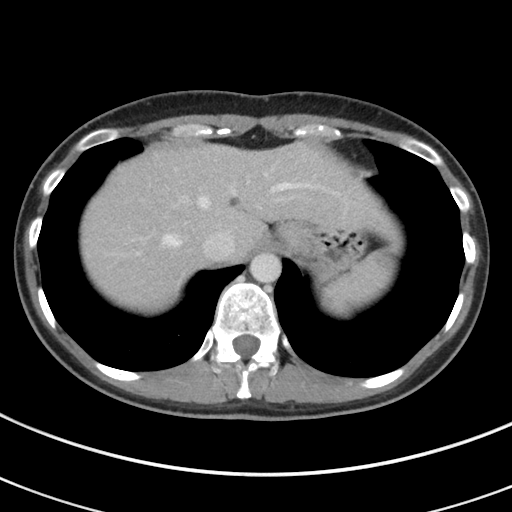
[im 85/89  soft-tissue]
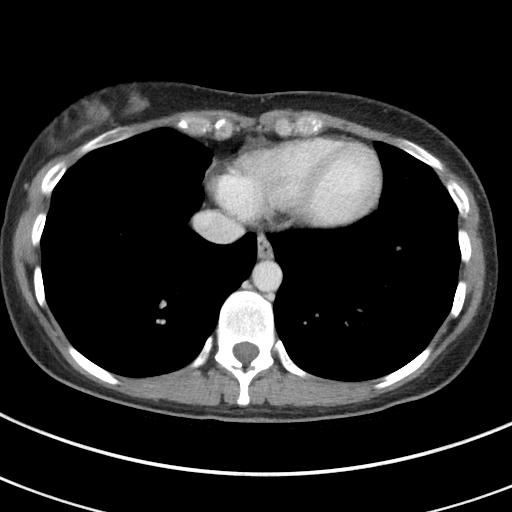

[Series 5: coronal st · coronal · 0.69mm/px · 3 of 69 slices shown]
[im 23/69  soft-tissue]
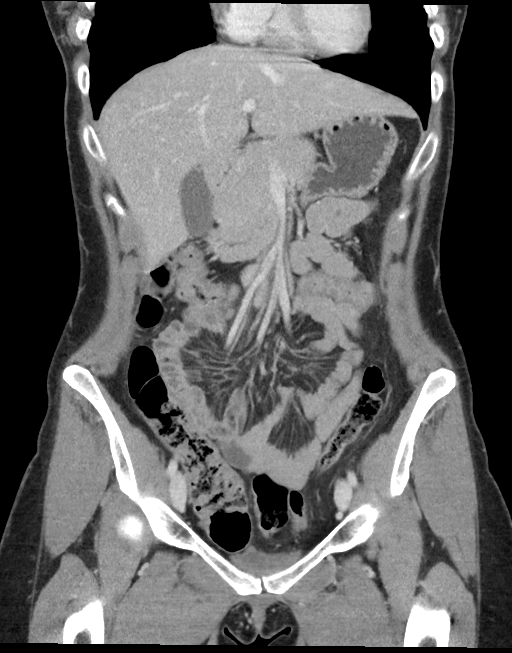
[im 31/69  soft-tissue]
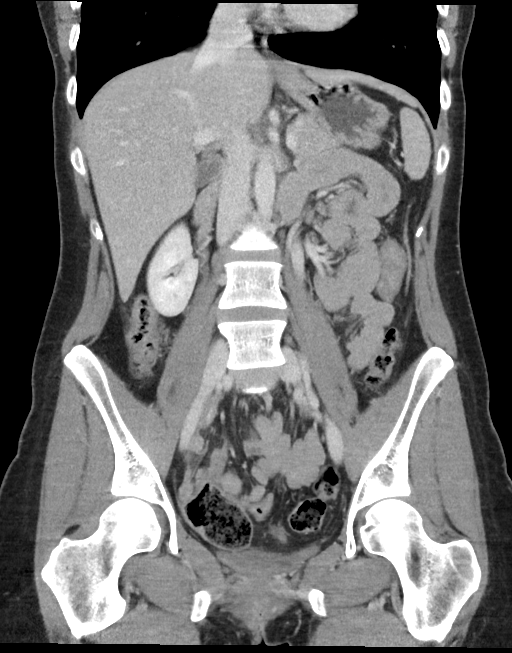
[im 38/69  soft-tissue]
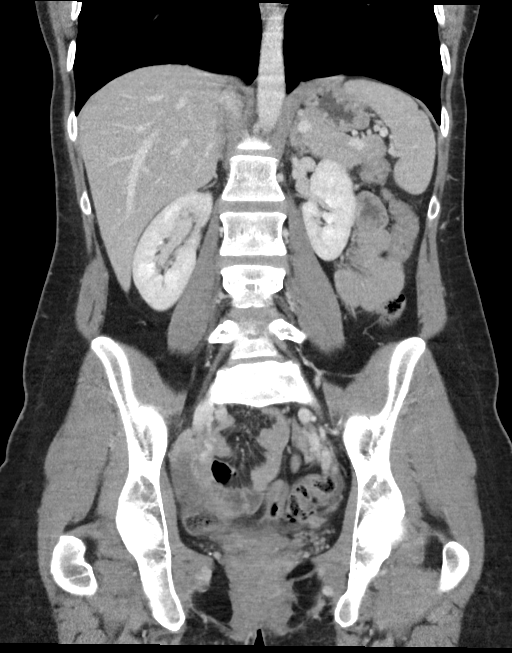

[15 of 46 positions shown; findings below may reference images not displayed]

FINDINGS: Lower chest: Negative.

Hepatobiliary: Small simple fluid density area along the anterior
liver capsule on series 2, image 17 is most likely a small benign
cyst. There is probably a 2nd such lesion near the central liver
dome on image 12. Negative gallbladder.

Pancreas: Negative.

Spleen: Negative.

Adrenals/Urinary Tract: Normal adrenal glands.

Symmetric and normal renal enhancement aside from a small area of
chronic left upper pole cortical scarring laterally on series 2,
image 20. Proximal ureters are decompressed.

Diminutive and unremarkable urinary bladder. Multiple pelvic
phleboliths.

Stomach/Bowel: Decompressed rectosigmoid colon.  Redundant sigmoid.

The transverse and descending colon are decompressed, and mild wall
thickening in those segments is possible (series 2, image 27). But
there is no adjacent mesenteric stranding. At the hepatic flexure
the transverse colon appears normal. The right colon also has a more
normal appearance. The cecum is mostly located in the pelvis.

As seen on axial series 2 images 63 through 68 and coronal image 29
there appears to be a dilated and thickened appendix along the right
pelvic sidewall without gas in the lumen. This measures up to 10
millimeters in thickness. However, there is little if any adjacent
fat stranding, and this loop has a similar appearance to regional
nondilated small bowel. The terminal ileum is identified on coronal
image 28.

Decompressed small bowel throughout the abdomen. No free air. No
abdominal free fluid. Negative stomach.

Vascular/Lymphatic: Major arterial structures are patent and appear
normal. Portal venous system is patent.

No lymphadenopathy.

Reproductive: Within normal limits.  Retroverted uterus.

Other: Trace simple appearing free fluid in the cul-de-sac.

Musculoskeletal: Negative.
IMPRESSION: 1. Equivocal for early acute appendicitis: Suggestion of thickened
and dilated appendix along the right pelvic side wall, but no
convincing regional inflammation, and it is possible this might be
artifact from a decompressed distal small bowel loop.
Depending on symptoms, a repeat CT in [DATE] hours of the pelvis to
include the lower abdomen and following oral contrast administration
may best evaluate further.
2. Questionable bowel wall thickening in the descending colon felt
to be artifact due to under distention.
3. Otherwise normal CT appearance of the abdomen and pelvis.
